# Patient Record
Sex: Male | Born: 1970 | Race: Black or African American | Hispanic: No | Marital: Married | Smoking: Former smoker
Health system: Southern US, Community
[De-identification: ages and names within clinical notes are randomized; demographics above are authoritative.]

---

## 2000-03-01 ENCOUNTER — Emergency Department (HOSPITAL_COMMUNITY): Admission: EM | Admit: 2000-03-01 | Discharge: 2000-03-02 | Payer: Self-pay | Admitting: *Deleted

## 2004-08-05 ENCOUNTER — Encounter: Admission: RE | Admit: 2004-08-05 | Discharge: 2004-08-05 | Payer: Self-pay | Admitting: Emergency Medicine

## 2012-11-16 ENCOUNTER — Emergency Department (HOSPITAL_COMMUNITY)
Admission: EM | Admit: 2012-11-16 | Discharge: 2012-11-16 | Disposition: A | Discharge status: Home / Self Care | Payer: Self-pay | Attending: Emergency Medicine | Admitting: Emergency Medicine

## 2012-11-16 ENCOUNTER — Encounter (HOSPITAL_COMMUNITY): Payer: Self-pay | Admitting: Emergency Medicine

## 2012-11-16 ENCOUNTER — Emergency Department (HOSPITAL_COMMUNITY): Payer: Self-pay

## 2012-11-16 DIAGNOSIS — Y92838 Other recreation area as the place of occurrence of the external cause: Secondary | ICD-10-CM | POA: Insufficient documentation

## 2012-11-16 DIAGNOSIS — Y9367 Activity, basketball: Secondary | ICD-10-CM | POA: Insufficient documentation

## 2012-11-16 DIAGNOSIS — S82892A Other fracture of left lower leg, initial encounter for closed fracture: Secondary | ICD-10-CM

## 2012-11-16 DIAGNOSIS — S82899A Other fracture of unspecified lower leg, initial encounter for closed fracture: Secondary | ICD-10-CM | POA: Insufficient documentation

## 2012-11-16 DIAGNOSIS — X500XXA Overexertion from strenuous movement or load, initial encounter: Secondary | ICD-10-CM | POA: Insufficient documentation

## 2012-11-16 DIAGNOSIS — Z87891 Personal history of nicotine dependence: Secondary | ICD-10-CM | POA: Insufficient documentation

## 2012-11-16 DIAGNOSIS — Y9239 Other specified sports and athletic area as the place of occurrence of the external cause: Secondary | ICD-10-CM | POA: Insufficient documentation

## 2012-11-16 MED ORDER — OXYCODONE-ACETAMINOPHEN 5-325 MG PO TABS
1.0000 | ORAL_TABLET | Freq: Once | ORAL | Status: AC
  Administered 2012-11-16: 1 via ORAL
  Filled 2012-11-16: qty 1

## 2012-11-16 MED ORDER — HYDROCODONE-ACETAMINOPHEN 5-325 MG PO TABS: 1.0000 | ORAL_TABLET | Freq: Four times a day (QID) | ORAL | Status: AC | PRN

## 2012-11-16 MED ORDER — IBUPROFEN 600 MG PO TABS: 600.0000 mg | ORAL_TABLET | Freq: Four times a day (QID) | ORAL | Status: DC | PRN

## 2012-11-16 NOTE — ED Provider Notes (Signed)
History     CSN: [id number]  Arrival date & time 11/16/12  0217   First MD Initiated Contact with Patient 11/16/12 0247      Chief Complaint  Patient presents with  . Foot Injury    (Consider location/radiation/quality/duration/timing/severity/associated sxs/prior treatment) HPI  41 year old male presents complaining of left foot injury. Patient reports while playing basketball tonight, patient jumped and landed on another player's foot. He felt his left foot had everted and felt a pop. Fell to the ground but did not hit his head or loss of consciousness. Pt reports acute onset of sharp throbbing pain to his left ankle, unable to bear any weight. Pain is non radiating, 7/10 in severity, worsening with movement. This incident happened 6 hours ago. He has iced the ankle which provide some relief.  Denies knee or hip pain, denies numbness.  No prior ankle injury.    History reviewed. No pertinent past medical history.  History reviewed. No pertinent past surgical history.  No family history on file.  History  Substance Use Topics  . Smoking status: Former Games developer  . Smokeless tobacco: Not on file  . Alcohol Use: No      Review of Systems  Constitutional: Negative for fever.  Musculoskeletal: Positive for arthralgias. Negative for back pain.  Skin: Negative for rash and wound.  Neurological: Negative for numbness.    Allergies  Review of patient's allergies indicates no known allergies.  Home Medications  No current outpatient prescriptions on file.  BP 111/95  Pulse 77  Temp 99.2 F (37.3 C) (Oral)  Resp 18  Ht 6\' 1"  (1.854 m)  Wt 200 lb (90.719 kg)  BMI 26.39 kg/m2  SpO2 97%  Physical Exam  Nursing note and vitals reviewed. Constitutional: He appears well-developed and well-nourished. No distress.  HENT:  Head: Atraumatic.  Eyes: Conjunctivae normal are normal.  Neck: Neck supple.  Abdominal: There is no tenderness.  Musculoskeletal: He exhibits  tenderness.       Left hip: Normal.       Left knee: Normal.       Left ankle: He exhibits decreased range of motion and swelling. He exhibits no ecchymosis, no deformity and no laceration. tenderness. Lateral malleolus and medial malleolus tenderness found. No AITFL, no CF ligament, no posterior TFL, no head of 5th metatarsal and no proximal fibula tenderness found. Achilles tendon normal.  Neurological: He is alert.  Skin: No rash noted.    ED Course  Procedures (including critical care time)  No results found for this or any previous visit. Dg Ankle Complete Left  11/16/2012  *RADIOLOGY REPORT*  Clinical Data: Lateral pain after twisting injury.  LEFT ANKLE COMPLETE - 3+ VIEW  Comparison: None.  Findings: Lateral soft tissue swelling in the left ankle.  There is an acute appearing bone fragment inferior to the lateral malleolus consistent with avulsion fragment.  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  Hypertrophic changes in the anterior tibiotalar joint.  No displaced fractures identified.  IMPRESSION: Avulsion fragment inferior to the lateral malleolus with associated soft tissue swelling.   Original Report Authenticated By: Burman Nieves, M.D.    Dg Foot Complete Left  11/16/2012  *RADIOLOGY REPORT*  Clinical Data: Lateral pain after twisting injury.  LEFT FOOT - COMPLETE 3+ VIEW  Comparison: None.  Findings: Degenerative changes in the first metatarsal phalangeal joint.  Degenerative changes in the anterior tibiotalar joint. Prominent posterior process of the talus.  No evidence of acute fracture or  subluxation.  No focal bone lesion or bone destruction. Bone cortex and trabecular architecture appear intact.  IMPRESSION: Degenerative changes.  No acute bony abnormalities.   Original Report Authenticated By: Burman Nieves, M.D.     1. Left ankle fracture, closed.     MDM  L ankle injury from playing basketball.  Xray ordered, pain medication given.  Pt is  NVI.    3:09 AM X-ray of left ankle reveals avulsion fragment inferior to the lateral malleolus with associated soft tissue swelling.  Left foot x-ray is unremarkable.  Care discussed with my attending, will give pt cam walker and suggest non weight bearing x 1 week.  Ortho referral given.  Pt has his own crutches in which he can use.  Pt voice understanding and agrees with plan.    BP 111/95  Pulse 77  Temp 99.2 F (37.3 C) (Oral)  Resp 18  Ht 6\' 1"  (1.854 m)  Wt 200 lb (90.719 kg)  BMI 26.39 kg/m2  SpO2 97%  I have reviewed nursing notes and vital signs. I personally reviewed the imaging tests through PACS system  I reviewed available ER/hospitalization records thought the EMR       Fayrene Helper, [place] 11/16/12 0327

## 2012-11-16 NOTE — ED Provider Notes (Signed)
Medical screening examination/treatment/procedure(s) were performed by non-physician practitioner and as supervising physician I was immediately available for consultation/collaboration.    Vida Roller, MD 11/16/12 [phone]

## 2012-11-16 NOTE — ED Notes (Signed)
Pt c/o L foot pain after hearing/feeling pop while playing basketball. Swelling noted

## 2014-02-15 IMAGING — CR DG ANKLE COMPLETE 3+V*L*
3 series · 3 of 3 positions shown · non-contrast
Comparison: None.

CLINICAL DATA: Lateral pain after twisting injury.

LEFT ANKLE COMPLETE - 3+ VIEW

[x ankle ap left]
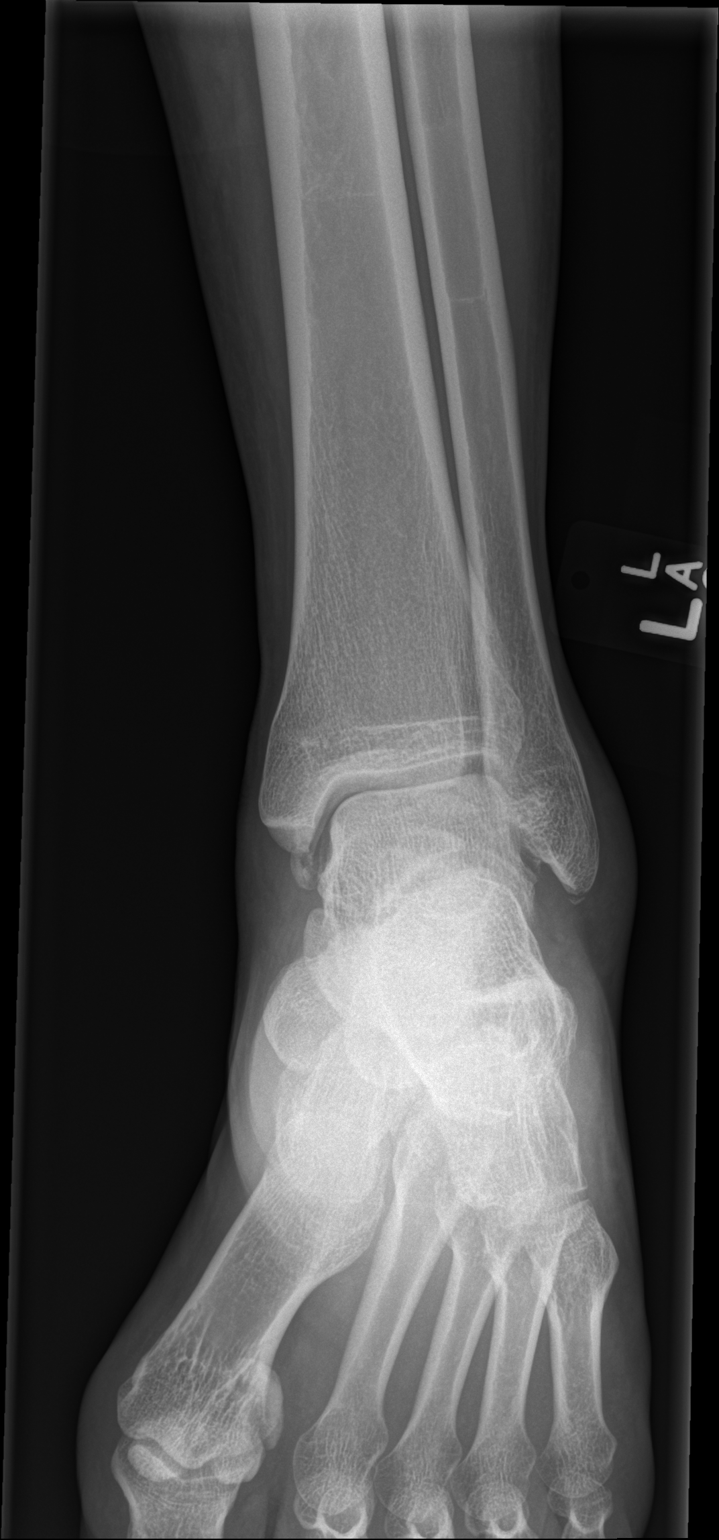

[x ankle obl left]
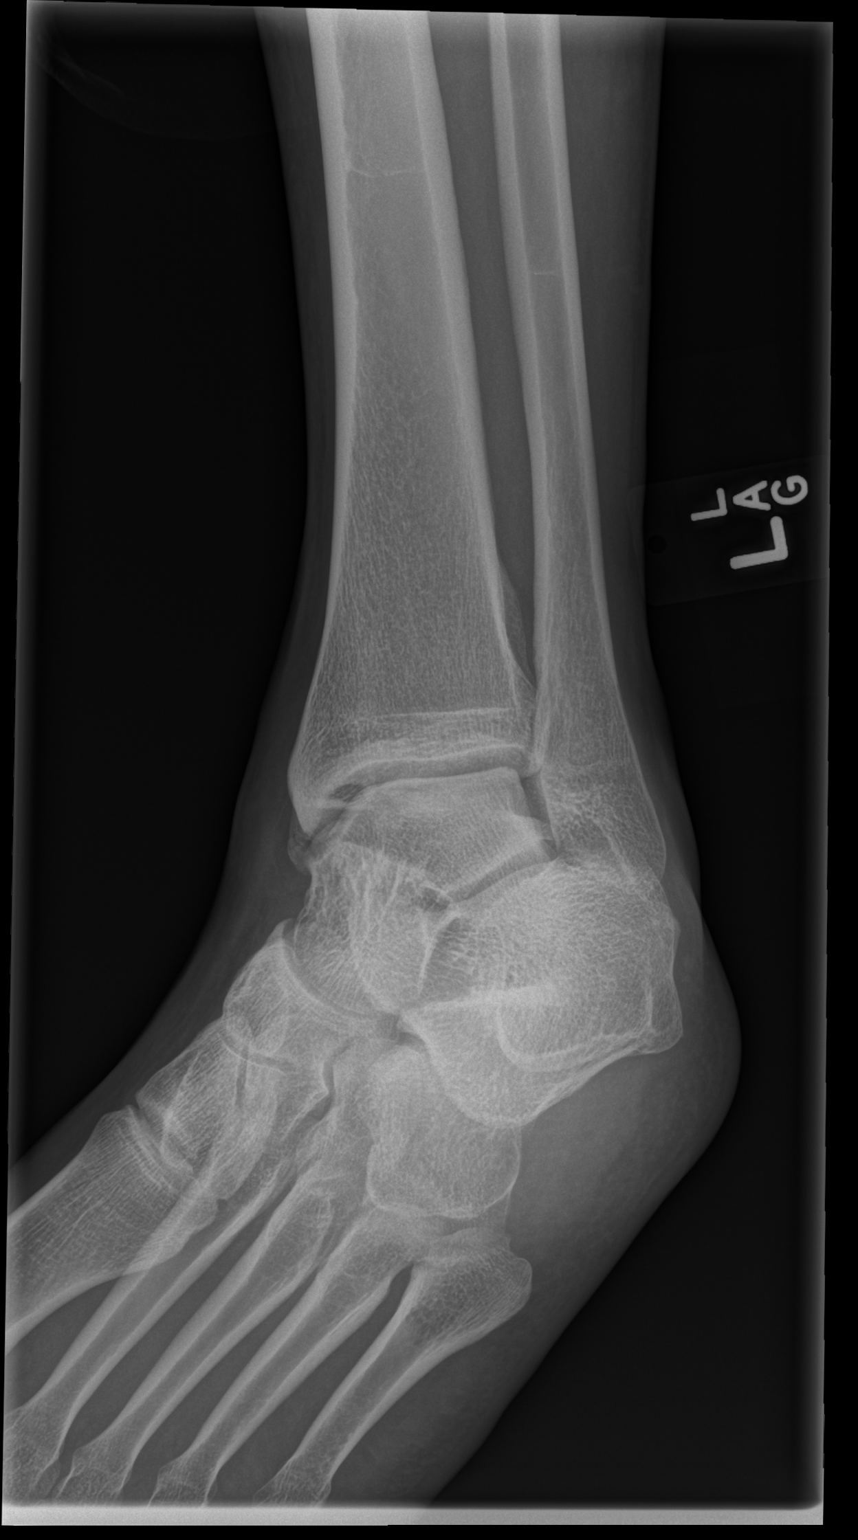

[x ankle lat left]
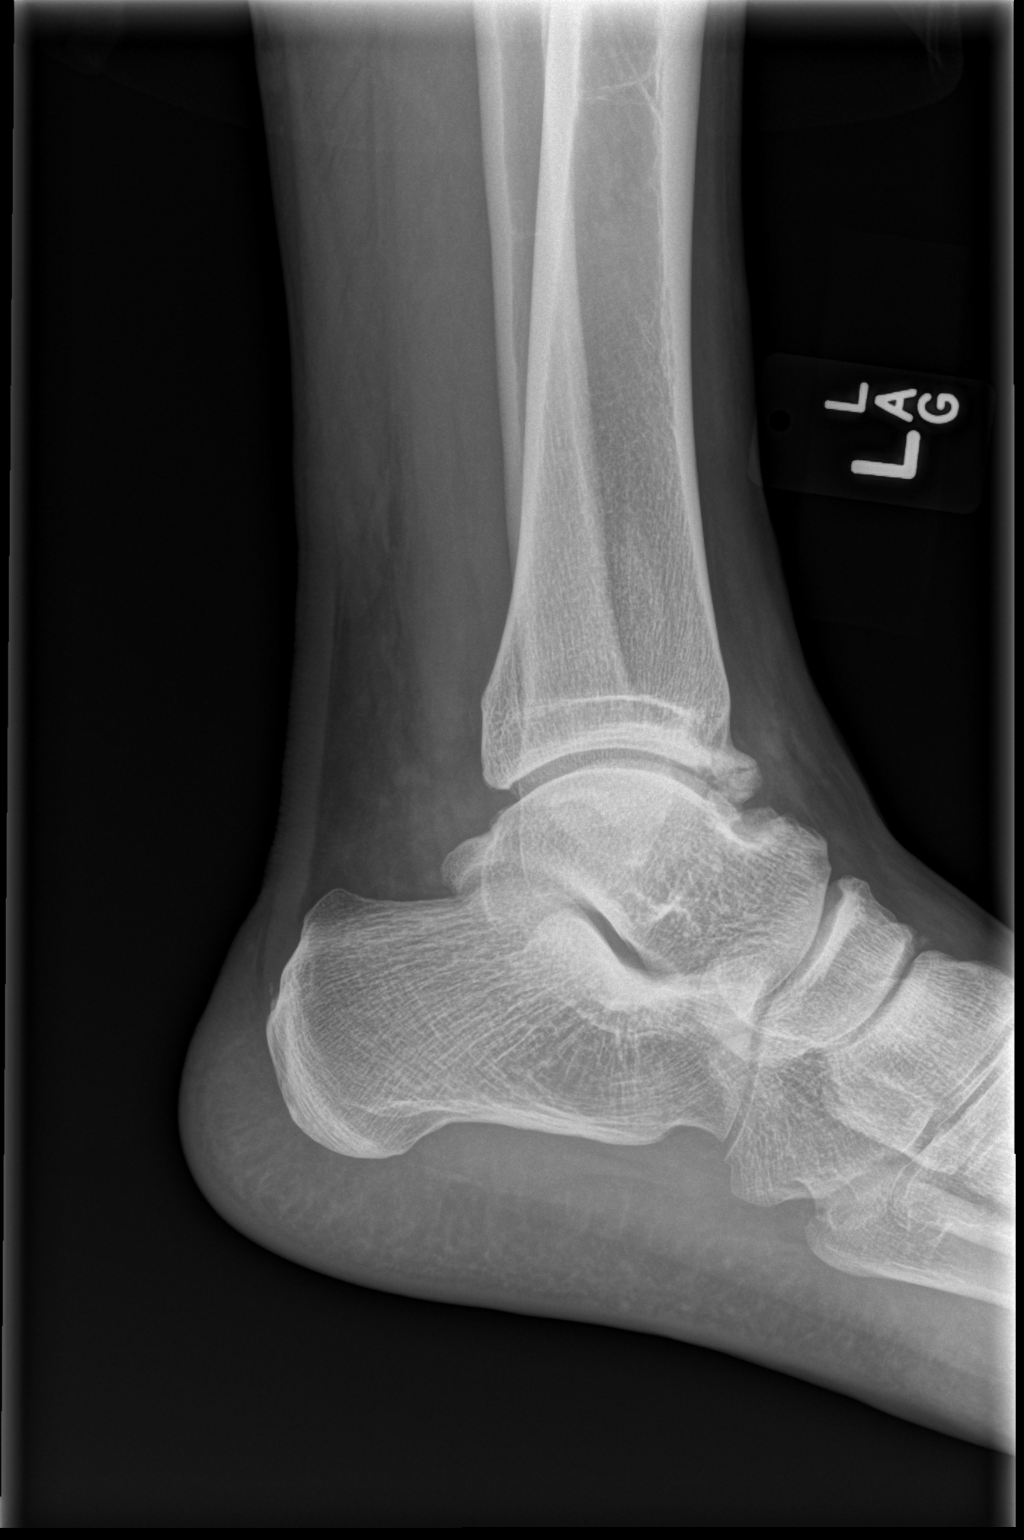

[3 of 3 positions shown; findings below may reference images not displayed]

FINDINGS: Lateral soft tissue swelling in the left ankle.  There is
an acute appearing bone fragment inferior to the lateral malleolus
consistent with avulsion fragment.  Old appearing ununited ossicle
inferior to the medial malleolus.  Hypertrophic changes in the
anterior tibiotalar joint.  No displaced fractures identified.
IMPRESSION: Avulsion fragment inferior to the lateral malleolus with associated
soft tissue swelling.

## 2019-01-04 ENCOUNTER — Ambulatory Visit: Payer: 59

## 2019-01-04 ENCOUNTER — Ambulatory Visit
Admission: EM | Admit: 2019-01-04 | Discharge: 2019-01-04 | Disposition: A | Discharge status: Home / Self Care | Payer: 59 | Attending: Emergency Medicine | Admitting: Emergency Medicine

## 2019-01-04 ENCOUNTER — Encounter: Payer: Self-pay | Admitting: Emergency Medicine

## 2019-01-04 DIAGNOSIS — J181 Lobar pneumonia, unspecified organism: Secondary | ICD-10-CM | POA: Insufficient documentation

## 2019-01-04 DIAGNOSIS — J189 Pneumonia, unspecified organism: Secondary | ICD-10-CM

## 2019-01-04 MED ORDER — BENZONATATE 200 MG PO CAPS: 200.0000 mg | ORAL_CAPSULE | Freq: Three times a day (TID) | ORAL | 0 refills | Status: AC | PRN

## 2019-01-04 MED ORDER — IBUPROFEN 600 MG PO TABS: 600.0000 mg | ORAL_TABLET | Freq: Four times a day (QID) | ORAL | 0 refills | Status: AC | PRN

## 2019-01-04 MED ORDER — HYDROCODONE-HOMATROPINE 5-1.5 MG/5ML PO SYRP: 5.0000 mL | ORAL_SOLUTION | Freq: Every evening | ORAL | 0 refills | Status: AC | PRN

## 2019-01-04 MED ORDER — ACETAMINOPHEN 325 MG PO TABS
975.0000 mg | ORAL_TABLET | Freq: Once | ORAL | Status: AC
  Administered 2019-01-04: 975 mg via ORAL

## 2019-01-04 MED ORDER — AMOXICILLIN 500 MG PO CAPS: 1000.0000 mg | ORAL_CAPSULE | Freq: Three times a day (TID) | ORAL | 0 refills | Status: AC

## 2019-01-04 MED ORDER — AZITHROMYCIN 250 MG PO TABS: 250.0000 mg | ORAL_TABLET | Freq: Every day | ORAL | 0 refills | Status: AC

## 2019-01-04 NOTE — ED Notes (Signed)
Patient able to ambulate independently  

## 2019-01-04 NOTE — ED Provider Notes (Signed)
[facility]    CSN: [id number] Arrival date & time: 01/04/19  1048     History   Chief Complaint Chief Complaint  Patient presents with  . URI    HPI George Gay is a 48 y.o. male no significant past medical history presenting today for evaluation of fever and cough.  Patient states that over the past 4 to 5 days he has had fever, cough.  He has developed chest discomfort especially with coughing.  He has noted fevers up to 102.  He is also had low energy and decreased appetite.  He has tried Tylenol but no other over-the-counter medicines.  HPI  History reviewed. No pertinent past medical history.  There are no active problems to display for this patient.   History reviewed. No pertinent surgical history.     Home Medications    Prior to Admission medications   Medication Sig Start Date End Date Taking? Authorizing Provider  amoxicillin (AMOXIL) 500 MG capsule Take 2 capsules (1,000 mg total) by mouth 3 (three) times daily for 7 days. 01/04/19 01/11/19  Morgan Rennert C, PA-C  azithromycin (ZITHROMAX) 250 MG tablet Take 1 tablet (250 mg total) by mouth daily. Take first 2 tablets together, then 1 every day until finished. 01/04/19   Yassmine Tamm C, PA-C  benzonatate (TESSALON) 200 MG capsule Take 1 capsule (200 mg total) by mouth 3 (three) times daily as needed for up to 7 days for cough. 01/04/19 01/11/19  Wes Lezotte C, PA-C  HYDROcodone-acetaminophen (NORCO/VICODIN) 5-325 MG per tablet Take 1 tablet by mouth every 6 (six) hours as needed for pain. 11/16/12   Fayrene Helperran, [place], PA-C  HYDROcodone-homatropine [facility](HYCODAN) 5-1.5 MG/5ML syrup Take 5 mLs by mouth at bedtime as needed for cough. 01/04/19   Brielynn Sekula C, PA-C  ibuprofen (ADVIL,MOTRIN) 600 MG tablet Take 1 tablet (600 mg total) by mouth every 6 (six) hours as needed. 01/04/19   Jenissa Tyrell, Junius CreamerHallie C, PA-C    Family History History reviewed. No pertinent family history.  Social History Social History     Tobacco Use  . Smoking status: Former Games developermoker  . Smokeless tobacco: Never Used  Substance Use Topics  . Alcohol use: No  . Drug use: No     Allergies   Patient has no known allergies.   Review of Systems Review of Systems  Constitutional: Positive for appetite change, chills, fatigue and fever. Negative for activity change.  HENT: Positive for congestion, rhinorrhea and sore throat. Negative for ear pain, sinus pressure and trouble swallowing.   Eyes: Negative for discharge and redness.  Respiratory: Positive for cough. Negative for chest tightness and shortness of breath.   Cardiovascular: Positive for chest pain.  Gastrointestinal: Negative for abdominal pain, diarrhea, nausea and vomiting.  Musculoskeletal: Negative for myalgias.  Skin: Negative for rash.  Neurological: Negative for dizziness, light-headedness and headaches.     Physical Exam Triage Vital Signs ED Triage Vitals [01/04/19 1057]  Enc Vitals Group     BP (!) 144/102     Pulse Rate (!) 108     Resp (!) 22     Temp (!) 100.8 F (38.2 C)     Temp Source Oral     SpO2 92 %     Weight      Height      Head Circumference      Peak Flow      Pain Score 8     Pain Loc  Pain Edu?      Excl. in GC?    No data found.  Updated Vital Signs BP (!) 144/102 (BP Location: Left Arm)   Pulse (!) 108   Temp (!) 100.8 F (38.2 C) (Oral)   Resp (!) 22   SpO2 92%  O2 rechecked, max of 94%, did increase to 95/96% while talking Visual Acuity Right Eye Distance:   Left Eye Distance:   Bilateral Distance:    Right Eye Near:   Left Eye Near:    Bilateral Near:     Physical Exam Vitals signs and nursing note reviewed.  Constitutional:      Appearance: He is well-developed.     Comments: No acute distress  HENT:     Head: Normocephalic and atraumatic.     Ears:     Comments: Bilateral ears without tenderness to palpation of external auricle, tragus and mastoid, EAC's without erythema or swelling,  TM's with good bony landmarks and cone of light. Non erythematous.    Nose: Nose normal.     Mouth/Throat:     Comments: Oral mucosa pink and moist, no tonsillar enlargement or exudate. Posterior pharynx patent and nonerythematous, no uvula deviation or swelling. Normal phonation. Eyes:     Conjunctiva/sclera: Conjunctivae normal.  Neck:     Musculoskeletal: Neck supple.  Cardiovascular:     Rate and Rhythm: Normal rate.  Pulmonary:     Effort: Pulmonary effort is normal. No respiratory distress.     Breath sounds: Rhonchi present.     Comments: Coarse breath sounds auscultated, breathing comfortably at rest Abdominal:     General: There is no distension.  Musculoskeletal: Normal range of motion.  Skin:    General: Skin is warm and dry.  Neurological:     Mental Status: He is alert and oriented to person, place, and time.      UC Treatments / Results  Labs (all labs ordered are listed, but only abnormal results are displayed) Labs Reviewed - No data to display  EKG None  Radiology Dg Chest 2 View  Result Date: 01/04/2019 CLINICAL DATA:  Cough. EXAM: CHEST - 2 VIEW COMPARISON:  None. FINDINGS: Heart size is. Right upper lobe airspace disease is present. Air bronchograms are noted. Left lung is clear. There is no edema or effusion. Visualized soft tissues and bony thorax are unremarkable. IMPRESSION: One right upper lobe pneumonia. Electronically Signed   By: Marin Robertshristopher  Mattern M.D.   On: 01/04/2019 11:47    Procedures Procedures (including critical care time)  Medications Ordered in UC Medications  acetaminophen (TYLENOL) tablet 975 mg (975 mg Oral Given 01/04/19 1101)    Initial Impression / Assessment and Plan / UC Course  I have reviewed the triage vital signs and the nursing notes.  Pertinent labs & imaging results that were available during my care of the patient were reviewed by me and considered in my medical decision making (see chart for details).      Right upper lobe pneumonia, will treat with amoxicillin 1 g 3 times daily x1 week as well as azithromycin.  Hycodan for cough at home/nighttime, Tessalon for during the day.  Discussed drowsiness regarding Hycodan.  Push fluidsa.  Tylenol and ibuprofen for fever.  Continue to monitor,Discussed strict return precautions. Patient verbalized understanding and is agreeable with plan.  Final Clinical Impressions(s) / UC Diagnoses   Final diagnoses:  Community acquired pneumonia of right upper lobe of lung [facility](HCC)     Discharge Instructions  You have a pneumonia in your right lung Please begin taking amoxicillin 1000 mg 3 times a day for the next week These also begin azithromycin 2 tablets today, 1 tablet for the following 4 days Alternate Tylenol and ibuprofen every 4 hours for better control of fever May use Tessalon for cough during the day Hycodan for nighttime cough or when you are at home, do not drive or work after taking as this will cause drowsiness Drink plenty of fluids, may mix honey and hot tea to help with throat discomfort and cough Please follow-up if symptoms not resolving, fever persisting or symptoms changing  Honey Tea  Use 3 teaspoons of honey with juice squeezed from half lemon. Place shaved pieces of ginger into 1/2-1 cup of water and warm over stove top. Then mix the ingredients and repeat every 4 hours as needed.    ED Prescriptions    Medication Sig Dispense Auth. Provider   HYDROcodone-homatropine (HYCODAN) 5-1.5 MG/5ML syrup Take 5 mLs by mouth at bedtime as needed for cough. 120 mL Meir Elwood C, PA-C   ibuprofen (ADVIL,MOTRIN) 600 MG tablet Take 1 tablet (600 mg total) by mouth every 6 (six) hours as needed. 30 tablet Kadeidra Coryell C, PA-C   azithromycin (ZITHROMAX) 250 MG tablet Take 1 tablet (250 mg total) by mouth daily. Take first 2 tablets together, then 1 every day until finished. 6 tablet Suann Klier C, PA-C   amoxicillin (AMOXIL) 500 MG  capsule Take 2 capsules (1,000 mg total) by mouth 3 (three) times daily for 7 days. 42 capsule Lorece Keach C, PA-C   benzonatate (TESSALON) 200 MG capsule Take 1 capsule (200 mg total) by mouth 3 (three) times daily as needed for up to 7 days for cough. 28 capsule Orel Cooler C, PA-C     Controlled Substance Prescriptions [place] Controlled Substance Registry consulted? Yes, I have consulted the [place] Controlled Substances Registry for this patient, and feel the risk/benefit ratio today is favorable for proceeding with this prescription for a controlled substance.   Lew Dawes, [place] 01/04/19 1208

## 2019-01-04 NOTE — ED Triage Notes (Signed)
Pt presents to [facility] for assessment of fever, congestion, cough and associated chest pain, and sob since Monday.

## 2019-01-04 NOTE — Discharge Instructions (Addendum)
You have a pneumonia in your right lung Please begin taking amoxicillin 1000 mg 3 times a day for the next week These also begin azithromycin 2 tablets today, 1 tablet for the following 4 days Alternate Tylenol and ibuprofen every 4 hours for better control of fever May use Tessalon for cough during the day Hycodan for nighttime cough or when you are at home, do not drive or work after taking as this will cause drowsiness Drink plenty of fluids, may mix honey and hot tea to help with throat discomfort and cough Please follow-up if symptoms not resolving, fever persisting or symptoms changing  Honey Tea  Use 3 teaspoons of honey with juice squeezed from half lemon. Place shaved pieces of ginger into 1/2-1 cup of water and warm over stove top. Then mix the ingredients and repeat every 4 hours as needed.

## 2020-04-04 IMAGING — DX DG CHEST 2V
2 series · 2 of 2 positions shown · non-contrast
Comparison: None.

CLINICAL DATA: Cough.

EXAM:
CHEST - 2 VIEW

[chest pa]
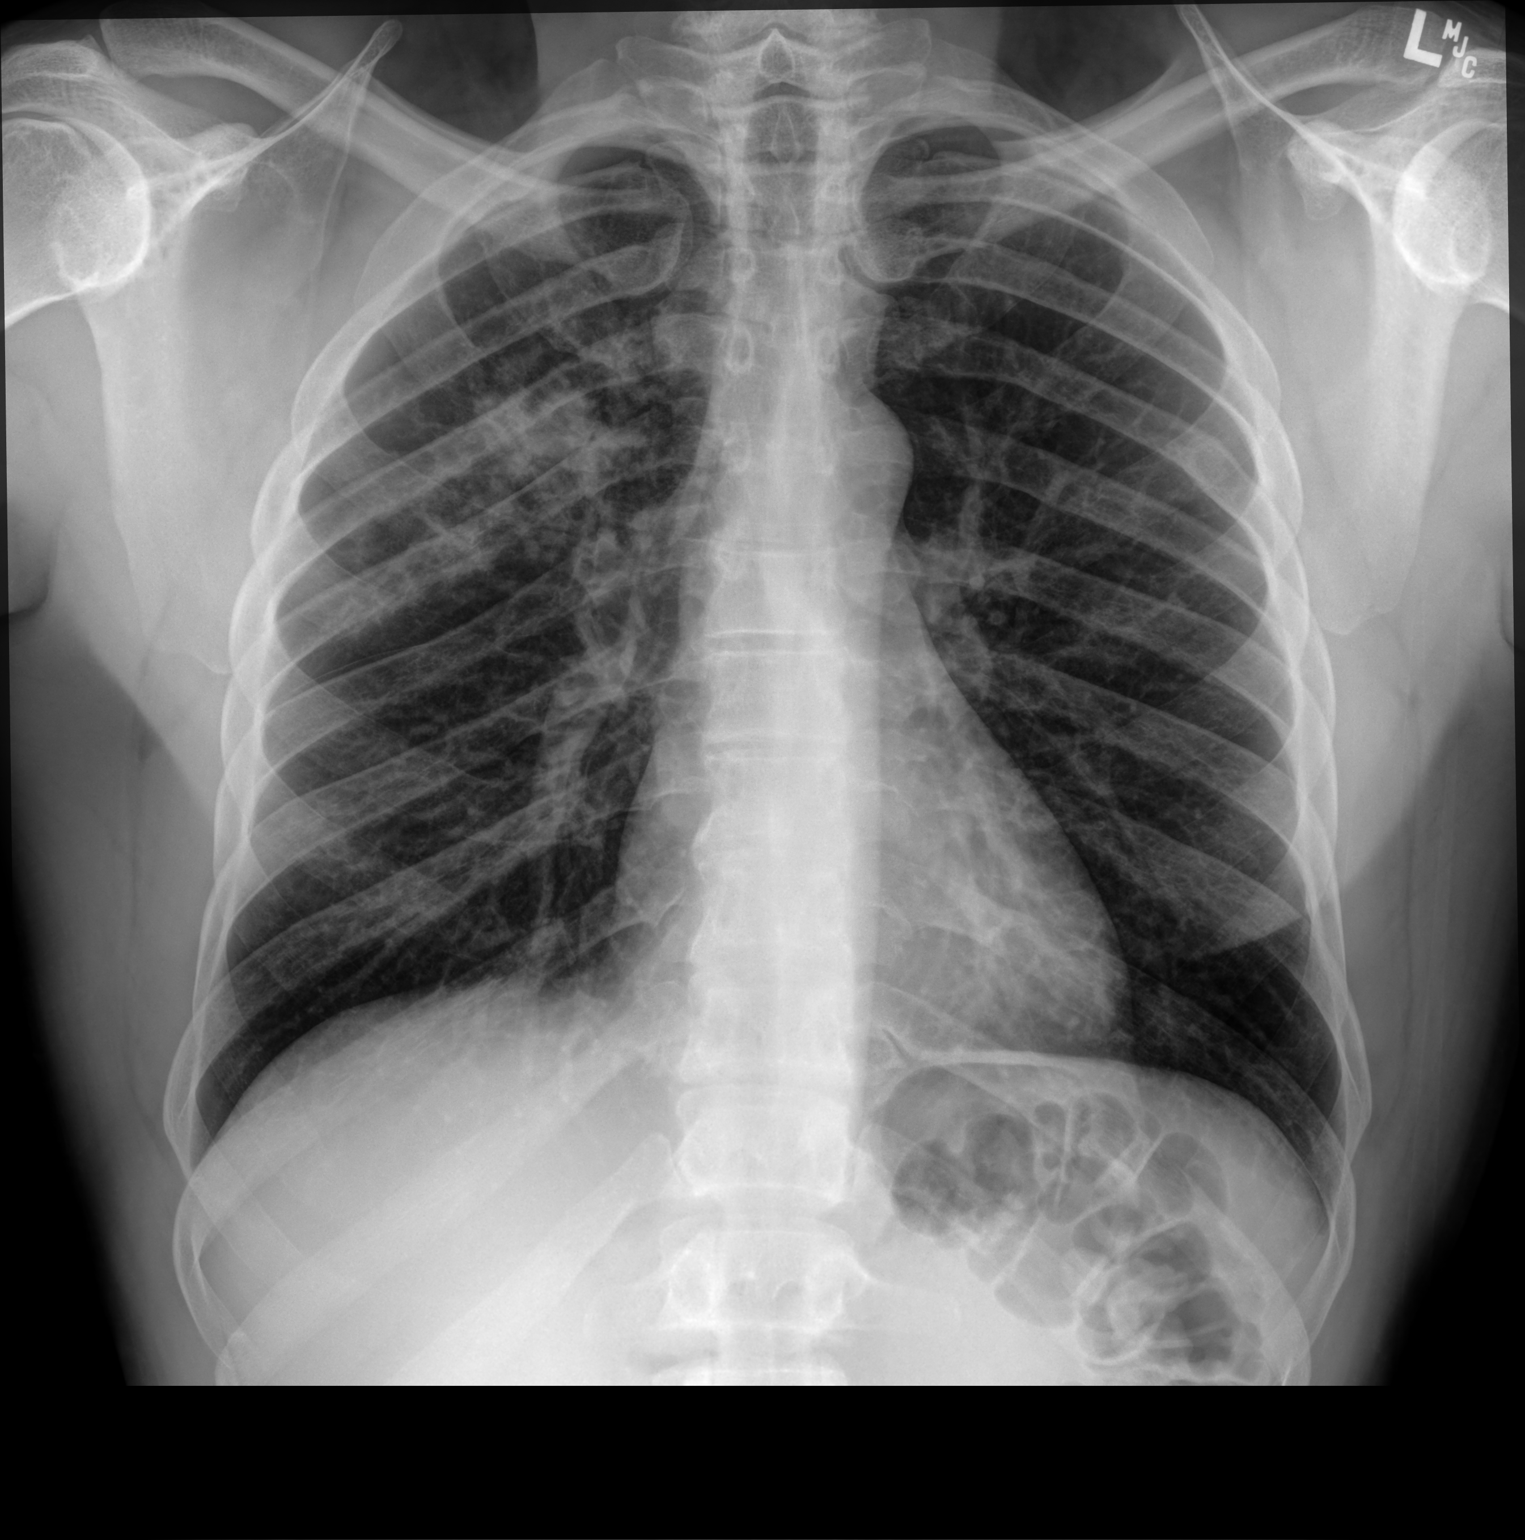

[chest lat]
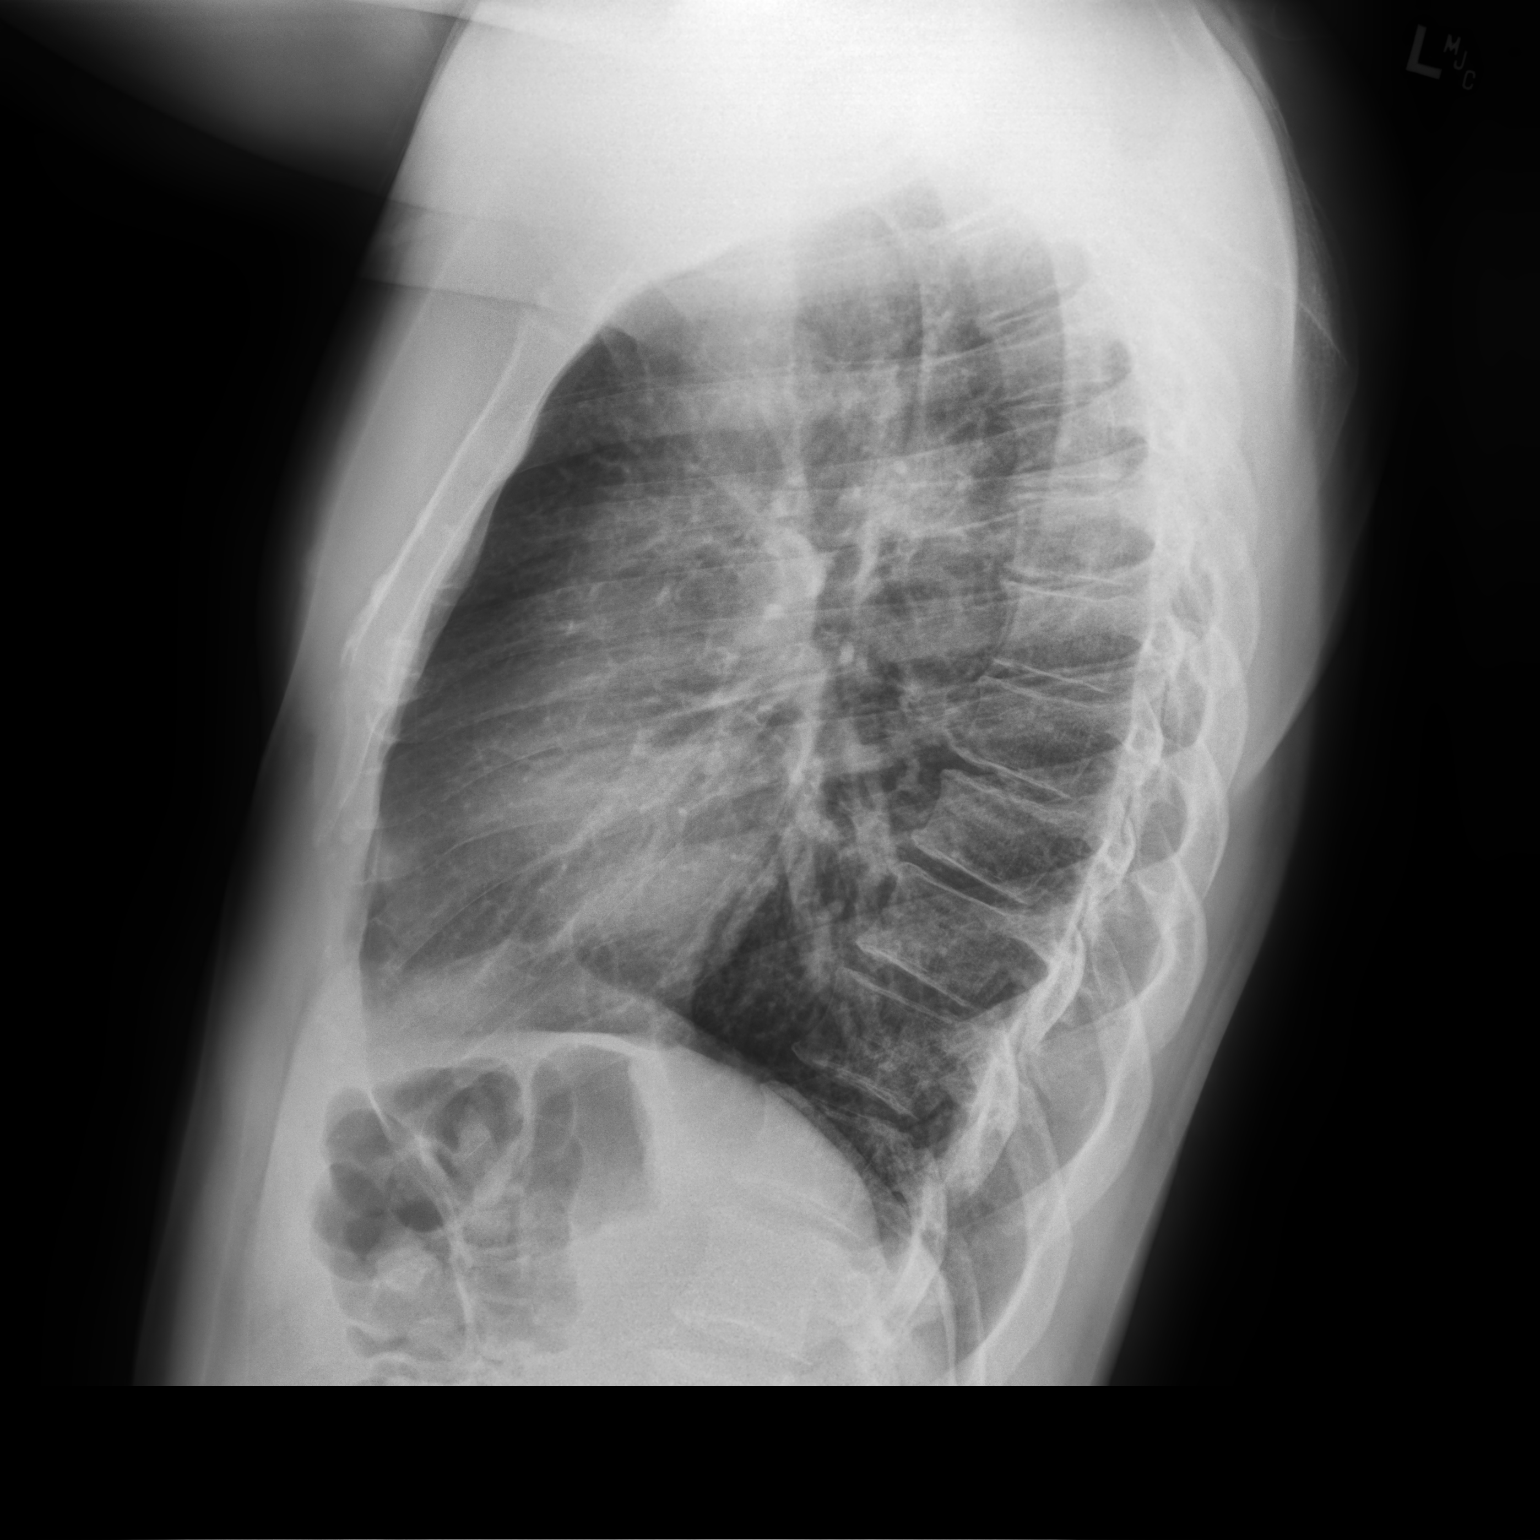

[2 of 2 positions shown; findings below may reference images not displayed]

FINDINGS: Heart size is. Right upper lobe airspace disease is present. Air
bronchograms are noted. Left lung is clear. There is no edema or
effusion. Visualized soft tissues and bony thorax are unremarkable.
IMPRESSION: One right upper lobe pneumonia.
